# Patient Record
Sex: Male | Born: 1995 | Hispanic: No | Marital: Single | Smoking: Former smoker
Health system: Southern US, Community
[De-identification: ages and names within clinical notes are randomized; demographics above are authoritative.]

## PROBLEM LIST (undated history)

## (undated) DIAGNOSIS — J45909 Unspecified asthma, uncomplicated: Secondary | ICD-10-CM

---
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
## 2015-01-24 ENCOUNTER — Emergency Department: Payer: Self-pay | Admitting: Emergency Medicine
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
## 2016-09-25 ENCOUNTER — Encounter: Payer: Self-pay | Admitting: *Deleted

## 2016-09-25 ENCOUNTER — Emergency Department
Admission: EM | Admit: 2016-09-25 | Discharge: 2016-09-25 | Disposition: A | Discharge status: Home / Self Care | Payer: No Typology Code available for payment source | Attending: Emergency Medicine | Admitting: Emergency Medicine

## 2016-09-25 DIAGNOSIS — Y92410 Unspecified street and highway as the place of occurrence of the external cause: Secondary | ICD-10-CM | POA: Diagnosis not present

## 2016-09-25 DIAGNOSIS — Y999 Unspecified external cause status: Secondary | ICD-10-CM | POA: Diagnosis not present

## 2016-09-25 DIAGNOSIS — S39012A Strain of muscle, fascia and tendon of lower back, initial encounter: Secondary | ICD-10-CM

## 2016-09-25 DIAGNOSIS — S161XXA Strain of muscle, fascia and tendon at neck level, initial encounter: Secondary | ICD-10-CM

## 2016-09-25 DIAGNOSIS — Y9389 Activity, other specified: Secondary | ICD-10-CM | POA: Diagnosis not present

## 2016-09-25 DIAGNOSIS — S199XXA Unspecified injury of neck, initial encounter: Secondary | ICD-10-CM | POA: Diagnosis present

## 2016-09-25 MED ORDER — CYCLOBENZAPRINE HCL 5 MG PO TABS: 5.0000 mg | ORAL_TABLET | Freq: Three times a day (TID) | ORAL | 0 refills | Status: DC | PRN

## 2016-09-25 MED ORDER — NAPROXEN 500 MG PO TABS: 500.0000 mg | ORAL_TABLET | Freq: Two times a day (BID) | ORAL | 0 refills | Status: DC
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
## 2016-09-25 NOTE — Discharge Instructions (Signed)
Please take medications as prescribed and return to the ER for any worsening symptoms urgent changes in her health.
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
## 2016-12-07 ENCOUNTER — Emergency Department
Admission: EM | Admit: 2016-12-07 | Discharge: 2016-12-07 | Disposition: A | Discharge status: Home / Self Care | Payer: No Typology Code available for payment source | Attending: Emergency Medicine | Admitting: Emergency Medicine

## 2016-12-07 DIAGNOSIS — Y92410 Unspecified street and highway as the place of occurrence of the external cause: Secondary | ICD-10-CM | POA: Diagnosis not present

## 2016-12-07 DIAGNOSIS — S39012A Strain of muscle, fascia and tendon of lower back, initial encounter: Secondary | ICD-10-CM

## 2016-12-07 DIAGNOSIS — Y999 Unspecified external cause status: Secondary | ICD-10-CM | POA: Diagnosis not present

## 2016-12-07 DIAGNOSIS — S3992XA Unspecified injury of lower back, initial encounter: Secondary | ICD-10-CM | POA: Diagnosis present

## 2016-12-07 DIAGNOSIS — Y939 Activity, unspecified: Secondary | ICD-10-CM | POA: Insufficient documentation

## 2016-12-07 MED ORDER — CYCLOBENZAPRINE HCL 5 MG PO TABS: 5.0000 mg | ORAL_TABLET | Freq: Three times a day (TID) | ORAL | 0 refills | Status: DC | PRN

## 2016-12-07 MED ORDER — NABUMETONE 750 MG PO TABS: 750.0000 mg | ORAL_TABLET | Freq: Two times a day (BID) | ORAL | 0 refills | Status: DC
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
## 2022-11-16 ENCOUNTER — Other Ambulatory Visit: Payer: Self-pay

## 2022-11-16 ENCOUNTER — Emergency Department: Payer: Medicaid Other

## 2022-11-16 DIAGNOSIS — R059 Cough, unspecified: Secondary | ICD-10-CM | POA: Diagnosis present

## 2022-11-16 DIAGNOSIS — J101 Influenza due to other identified influenza virus with other respiratory manifestations: Secondary | ICD-10-CM | POA: Insufficient documentation

## 2022-11-16 DIAGNOSIS — Z1152 Encounter for screening for COVID-19: Secondary | ICD-10-CM | POA: Diagnosis not present

## 2022-11-16 LAB — RESP PANEL BY RT-PCR (RSV, FLU A&B, COVID)  RVPGX2
Influenza A by PCR: POSITIVE — AB
Influenza B by PCR: NEGATIVE
Resp Syncytial Virus by PCR: NEGATIVE
SARS Coronavirus 2 by RT PCR: NEGATIVE

## 2022-11-16 MED ORDER — ACETAMINOPHEN 325 MG PO TABS
650.0000 mg | ORAL_TABLET | Freq: Once | ORAL | Status: AC | PRN
  Administered 2022-11-16: 650 mg via ORAL
  Filled 2022-11-16: qty 2

## 2022-11-16 NOTE — ED Triage Notes (Signed)
Pt arrives via POV with CC of nonproductive cough (thin clear secretions) that began yesterday. Airway patent at this time.

## 2022-11-17 ENCOUNTER — Emergency Department
Admission: EM | Admit: 2022-11-17 | Discharge: 2022-11-17 | Disposition: A | Discharge status: Home / Self Care | Payer: Medicaid Other | Attending: Emergency Medicine | Admitting: Emergency Medicine

## 2022-11-17 DIAGNOSIS — J101 Influenza due to other identified influenza virus with other respiratory manifestations: Secondary | ICD-10-CM

## 2022-11-17 MED ORDER — ACETAMINOPHEN 500 MG PO TABS
ORAL_TABLET | ORAL | Status: AC
  Administered 2022-11-17: 1000 mg via ORAL
  Filled 2022-11-17: qty 2

## 2022-11-17 MED ORDER — ACETAMINOPHEN 500 MG PO TABS: 1000.0000 mg | ORAL_TABLET | Freq: Once | ORAL | Status: AC

## 2022-11-17 NOTE — Discharge Instructions (Signed)
Please take Tylenol and ibuprofen/Advil for your pain.  It is safe to take them together, or to alternate them every few hours.  Take up to 1000mg of Tylenol at a time, up to 4 times per day.  Do not take more than 4000 mg of Tylenol in 24 hours.  For ibuprofen, take 400-600 mg, 3 - 4 times per day.  

## 2022-11-17 NOTE — ED Notes (Signed)
Pt refused labs and EKG.

## 2022-11-17 NOTE — ED Provider Notes (Signed)
[facility] Provider Note    Event Date/Time   First MD Initiated Contact with Patient 11/17/22 [phone]     (approximate)   History   Cough   HPI  Albert Wagner is a 26 y.o. male who presents to the ED for evaluation of Cough   Patient presents to the ED for evaluation of 3 days of cough, myalgias and malaise.  Poor appetite without emesis or diarrhea.  No chest pain or syncope.  Physical Exam   Triage Vital Signs: ED Triage Vitals  Enc Vitals Group     BP 11/16/22 2210 (!) 111/52     Pulse Rate 11/16/22 2210 (!) 134     Resp 11/16/22 2210 (!) 24     Temp 11/16/22 2210 (!) 102.6 F (39.2 C)     Temp Source 11/16/22 2210 Oral     SpO2 11/16/22 2210 93 %     Weight 11/16/22 2216 224 lb (101.6 kg)     Height 11/16/22 2216 5\' 10"  (1.778 m)     Head Circumference --      Peak Flow --      Pain Score 11/16/22 2216 8     Pain Loc --      Pain Edu? --      Excl. in GC? --     Most recent vital signs: Vitals:   11/17/22 0102 11/17/22 0358  BP: 99/77 (!) 140/85  Pulse: (!) 129 (!) 140  Resp: 20 20  Temp: 99.5 F (37.5 C) 99.8 F (37.7 C)  SpO2: 94% 96%    General: Awake, no distress.  Ambulatory with normal gait independently. CV:  Good peripheral perfusion.  Tachycardic and regular Resp:  Normal effort.  Clear without wheezing Abd:  No distention.  Soft and benign MSK:  No deformity noted.  Neuro:  No focal deficits appreciated. Other:     ED Results / Procedures / Treatments   Labs (all labs ordered are listed, but only abnormal results are displayed) Labs Reviewed  RESP PANEL BY RT-PCR (RSV, FLU A&B, COVID)  RVPGX2 - Abnormal; Notable for the following components:      Result Value   Influenza A by PCR POSITIVE (*)    All other components within normal limits    EKG   RADIOLOGY CXR interpreted by me without evidence of acute cardiopulmonary pathology.  Official radiology report(s): DG Chest 2 View  Result Date:  11/16/2022 CLINICAL DATA:  Cough. EXAM: CHEST - 2 VIEW COMPARISON:  None Available. FINDINGS: The heart size and mediastinal contours are within normal limits. Both lungs are clear. The visualized skeletal structures are unremarkable. IMPRESSION: No active cardiopulmonary disease. Electronically Signed   By: 11/18/2022 M.D.   On: 11/16/2022 22:49    PROCEDURES and INTERVENTIONS:  Procedures  Medications  acetaminophen (TYLENOL) tablet 650 mg (650 mg Oral Given 11/16/22 2225)  acetaminophen (TYLENOL) tablet 1,000 mg (1,000 mg Oral Given 11/17/22 0421)     IMPRESSION / MDM / ASSESSMENT AND PLAN / ED COURSE  I reviewed the triage vital signs and the nursing notes.  Differential diagnosis includes, but is not limited to, dehydration, AKI, sepsis, pneumonia, COVID-19, pneumothorax  {Patient presents with symptoms of an acute illness or injury that is potentially life-threatening.  26 year old presents with an acute cough, tested positive for influenza A but refusing further workup despite his persistent tachycardia.  Initially febrile and after receiving antipyretics his heart rate really does not improve.  He maintains good  blood pressure without signs of shock.  CXR is reassuring and he test positive for flu A on his viral panel.  As below, I urged him to stay for further workup considering the persistent tachycardia but he adamantly refuses and he has capacity make this decision, acknowledging risk of death and disability.  Clinical Course as of 11/17/22 1497  Wynelle Link Nov 17, 2022  0404 Very explicit conversation with this patient that I strongly urged him to stay for blood work and EKG because of his persistent tachycardia despite his antipyretics.  He refuses.  I explained to him that this could be a sign of more severe derangements that are life-threatening.  He still refuses and wants to leave because he has been here for so long.  He acknowledges the possibility of worsening morbidity or  mortality because of his refusal and says he is ready to leave.  I believe he has capacity to make this decision. [DS]    Clinical Course User Index [DS] Delton Prairie, MD     FINAL CLINICAL IMPRESSION(S) / ED DIAGNOSES   Final diagnoses:  Influenza A     Rx / DC Orders   ED Discharge Orders     None        Note:  This document was prepared using Dragon voice recognition software and may include unintentional dictation errors.    Delton Prairie, MD 11/17/22 [id number]

## 2022-11-24 ENCOUNTER — Encounter (HOSPITAL_COMMUNITY): Payer: Self-pay

## 2022-11-24 ENCOUNTER — Encounter (HOSPITAL_BASED_OUTPATIENT_CLINIC_OR_DEPARTMENT_OTHER): Payer: Self-pay

## 2022-11-24 ENCOUNTER — Other Ambulatory Visit: Payer: Self-pay

## 2022-11-24 ENCOUNTER — Emergency Department (HOSPITAL_BASED_OUTPATIENT_CLINIC_OR_DEPARTMENT_OTHER): Payer: Medicaid Other

## 2022-11-24 ENCOUNTER — Inpatient Hospital Stay (HOSPITAL_BASED_OUTPATIENT_CLINIC_OR_DEPARTMENT_OTHER)
Admission: EM | Admit: 2022-11-24 | Discharge: 2022-11-26 | DRG: 193 | Disposition: A | Discharge status: Home / Self Care | Payer: Medicaid Other | Attending: Internal Medicine | Admitting: Internal Medicine

## 2022-11-24 DIAGNOSIS — R7401 Elevation of levels of liver transaminase levels: Secondary | ICD-10-CM | POA: Diagnosis not present

## 2022-11-24 DIAGNOSIS — R0602 Shortness of breath: Secondary | ICD-10-CM | POA: Diagnosis present

## 2022-11-24 DIAGNOSIS — J159 Unspecified bacterial pneumonia: Secondary | ICD-10-CM | POA: Diagnosis present

## 2022-11-24 DIAGNOSIS — J189 Pneumonia, unspecified organism: Secondary | ICD-10-CM | POA: Diagnosis not present

## 2022-11-24 DIAGNOSIS — J45901 Unspecified asthma with (acute) exacerbation: Principal | ICD-10-CM | POA: Diagnosis present

## 2022-11-24 DIAGNOSIS — J9601 Acute respiratory failure with hypoxia: Secondary | ICD-10-CM | POA: Diagnosis present

## 2022-11-24 DIAGNOSIS — Z87891 Personal history of nicotine dependence: Secondary | ICD-10-CM | POA: Diagnosis not present

## 2022-11-24 DIAGNOSIS — Z1152 Encounter for screening for COVID-19: Secondary | ICD-10-CM

## 2022-11-24 DIAGNOSIS — Z720 Tobacco use: Secondary | ICD-10-CM

## 2022-11-24 DIAGNOSIS — J1108 Influenza due to unidentified influenza virus with specified pneumonia: Secondary | ICD-10-CM | POA: Diagnosis present

## 2022-11-24 DIAGNOSIS — Z888 Allergy status to other drugs, medicaments and biological substances status: Secondary | ICD-10-CM

## 2022-11-24 DIAGNOSIS — J101 Influenza due to other identified influenza virus with other respiratory manifestations: Secondary | ICD-10-CM

## 2022-11-24 DIAGNOSIS — E876 Hypokalemia: Secondary | ICD-10-CM | POA: Diagnosis not present

## 2022-11-24 HISTORY — DX: Unspecified asthma, uncomplicated: J45.909

## 2022-11-24 LAB — CBC WITH DIFFERENTIAL/PLATELET
Abs Immature Granulocytes: 0.21 10*3/uL — ABNORMAL HIGH (ref 0.00–0.07)
Basophils Absolute: 0.1 10*3/uL (ref 0.0–0.1)
Basophils Relative: 1 %
Eosinophils Absolute: 0 10*3/uL (ref 0.0–0.5)
Eosinophils Relative: 0 %
HCT: 47.5 % (ref 39.0–52.0)
Hemoglobin: 16.4 g/dL (ref 13.0–17.0)
Immature Granulocytes: 1 %
Lymphocytes Relative: 10 %
Lymphs Abs: 1.9 10*3/uL (ref 0.7–4.0)
MCH: 31.5 pg (ref 26.0–34.0)
MCHC: 34.5 g/dL (ref 30.0–36.0)
MCV: 91.2 fL (ref 80.0–100.0)
Monocytes Absolute: 2 10*3/uL — ABNORMAL HIGH (ref 0.1–1.0)
Monocytes Relative: 10 %
Neutro Abs: 15.4 10*3/uL — ABNORMAL HIGH (ref 1.7–7.7)
Neutrophils Relative %: 78 %
Platelets: 371 10*3/uL (ref 150–400)
RBC: 5.21 MIL/uL (ref 4.22–5.81)
RDW: 12.8 % (ref 11.5–15.5)
WBC: 19.6 10*3/uL — ABNORMAL HIGH (ref 4.0–10.5)
nRBC: 0 % (ref 0.0–0.2)

## 2022-11-24 LAB — RESP PANEL BY RT-PCR (RSV, FLU A&B, COVID)  RVPGX2
Influenza A by PCR: POSITIVE — AB
Influenza B by PCR: NEGATIVE
Resp Syncytial Virus by PCR: NEGATIVE
SARS Coronavirus 2 by RT PCR: NEGATIVE

## 2022-11-24 LAB — LACTIC ACID, PLASMA: Lactic Acid, Venous: 1.8 mmol/L (ref 0.5–1.9)

## 2022-11-24 LAB — COMPREHENSIVE METABOLIC PANEL
ALT: 71 U/L — ABNORMAL HIGH (ref 0–44)
AST: 54 U/L — ABNORMAL HIGH (ref 15–41)
Albumin: 2.8 g/dL — ABNORMAL LOW (ref 3.5–5.0)
Alkaline Phosphatase: 117 U/L (ref 38–126)
Anion gap: 14 (ref 5–15)
BUN: 8 mg/dL (ref 6–20)
CO2: 23 mmol/L (ref 22–32)
Calcium: 8.5 mg/dL — ABNORMAL LOW (ref 8.9–10.3)
Chloride: 98 mmol/L (ref 98–111)
Creatinine, Ser: 1.17 mg/dL (ref 0.61–1.24)
GFR, Estimated: >60 mL/min (ref >60)
Glucose, Bld: 141 mg/dL — ABNORMAL HIGH (ref 70–99)
Potassium: 3.5 mmol/L (ref 3.5–5.1)
Sodium: 135 mmol/L (ref 135–145)
Total Bilirubin: 0.7 mg/dL (ref 0.3–1.2)
Total Protein: 8.1 g/dL (ref 6.5–8.1)

## 2022-11-24 MED ORDER — LACTATED RINGERS IV BOLUS
1000.0000 mL | Freq: Once | INTRAVENOUS | Status: AC
  Administered 2022-11-24: 1000 mL via INTRAVENOUS

## 2022-11-24 MED ORDER — ALBUTEROL SULFATE (2.5 MG/3ML) 0.083% IN NEBU
15.0000 mg/h | INHALATION_SOLUTION | RESPIRATORY_TRACT | Status: AC
  Administered 2022-11-24: 15 mg/h via RESPIRATORY_TRACT
  Filled 2022-11-24: qty 3

## 2022-11-24 MED ORDER — ALBUTEROL SULFATE (2.5 MG/3ML) 0.083% IN NEBU: 2.5000 mg | INHALATION_SOLUTION | Freq: Four times a day (QID) | RESPIRATORY_TRACT | Status: DC | PRN

## 2022-11-24 MED ORDER — METHYLPREDNISOLONE SODIUM SUCC 125 MG IJ SOLR
125.0000 mg | Freq: Once | INTRAMUSCULAR | Status: AC
  Administered 2022-11-24: 125 mg via INTRAVENOUS
  Filled 2022-11-24: qty 2

## 2022-11-24 MED ORDER — MAGNESIUM SULFATE 2 GM/50ML IV SOLN
2.0000 g | Freq: Once | INTRAVENOUS | Status: AC
  Administered 2022-11-24: 2 g via INTRAVENOUS
  Filled 2022-11-24: qty 50

## 2022-11-24 MED ORDER — OSELTAMIVIR PHOSPHATE 75 MG PO CAPS
75.0000 mg | ORAL_CAPSULE | Freq: Two times a day (BID) | ORAL | Status: DC
  Administered 2022-11-24 – 2022-11-26 (×4): 75 mg via ORAL
  Filled 2022-11-24 (×5): qty 1

## 2022-11-24 MED ORDER — ORAL CARE MOUTH RINSE: 15.0000 mL | OROMUCOSAL | Status: DC | PRN

## 2022-11-24 MED ORDER — SODIUM CHLORIDE 0.9 % IV SOLN
1.0000 g | Freq: Once | INTRAVENOUS | Status: AC
  Administered 2022-11-24: 1 g via INTRAVENOUS
  Filled 2022-11-24: qty 10

## 2022-11-24 MED ORDER — LACTATED RINGERS IV BOLUS (SEPSIS)
1000.0000 mL | Freq: Once | INTRAVENOUS | Status: AC
  Administered 2022-11-24: 1000 mL via INTRAVENOUS

## 2022-11-24 MED ORDER — ALBUTEROL SULFATE (2.5 MG/3ML) 0.083% IN NEBU
INHALATION_SOLUTION | RESPIRATORY_TRACT | Status: AC
  Administered 2022-11-24: 2.5 mg via RESPIRATORY_TRACT
  Filled 2022-11-24: qty 3

## 2022-11-24 MED ORDER — ALBUTEROL (5 MG/ML) CONTINUOUS INHALATION SOLN
10.0000 mg/h | INHALATION_SOLUTION | Freq: Once | RESPIRATORY_TRACT | Status: AC
  Administered 2022-11-24: 10 mg/h via RESPIRATORY_TRACT
  Filled 2022-11-24: qty 20

## 2022-11-24 MED ORDER — TERBUTALINE SULFATE 1 MG/ML IJ SOLN
0.2500 mg | Freq: Once | INTRAMUSCULAR | Status: AC
  Administered 2022-11-24: 0.25 mg via SUBCUTANEOUS
  Filled 2022-11-24: qty 1

## 2022-11-24 MED ORDER — LACTATED RINGERS IV SOLN: INTRAVENOUS | Status: AC

## 2022-11-24 MED ORDER — ENOXAPARIN SODIUM 40 MG/0.4ML IJ SOSY
40.0000 mg | PREFILLED_SYRINGE | INTRAMUSCULAR | Status: DC
  Administered 2022-11-24 – 2022-11-25 (×2): 40 mg via SUBCUTANEOUS
  Filled 2022-11-24 (×2): qty 0.4

## 2022-11-24 MED ORDER — ALBUTEROL SULFATE (2.5 MG/3ML) 0.083% IN NEBU: 2.5000 mg | INHALATION_SOLUTION | Freq: Once | RESPIRATORY_TRACT | Status: AC

## 2022-11-24 MED ORDER — SODIUM CHLORIDE 0.9 % IV SOLN: INTRAVENOUS | Status: DC | PRN

## 2022-11-24 MED ORDER — CHLORHEXIDINE GLUCONATE CLOTH 2 % EX PADS
6.0000 | MEDICATED_PAD | Freq: Every day | CUTANEOUS | Status: DC
  Administered 2022-11-24 – 2022-11-25 (×2): 6 via TOPICAL

## 2022-11-24 MED ORDER — IPRATROPIUM-ALBUTEROL 0.5-2.5 (3) MG/3ML IN SOLN
RESPIRATORY_TRACT | Status: AC
  Administered 2022-11-24: 3 mL via RESPIRATORY_TRACT
  Filled 2022-11-24: qty 3

## 2022-11-24 MED ORDER — SODIUM CHLORIDE 0.9 % IV SOLN
500.0000 mg | Freq: Once | INTRAVENOUS | Status: AC
  Administered 2022-11-24: 500 mg via INTRAVENOUS
  Filled 2022-11-24: qty 5

## 2022-11-24 MED ORDER — ONDANSETRON HCL 4 MG/2ML IJ SOLN
4.0000 mg | Freq: Once | INTRAMUSCULAR | Status: AC
  Administered 2022-11-24: 4 mg via INTRAVENOUS
  Filled 2022-11-24: qty 2

## 2022-11-24 MED ORDER — IPRATROPIUM-ALBUTEROL 0.5-2.5 (3) MG/3ML IN SOLN: 3.0000 mL | Freq: Once | RESPIRATORY_TRACT | Status: AC

## 2022-11-24 NOTE — H&P (Signed)
History and Physical    Patient: Albert Wagner KVQ:[id number] DOB: 08-03-1996 DOA: 11/24/2022 DOS: the patient was seen and examined on 11/24/2022 PCP: Patient, No Pcp Per  Patient coming from:  [facility] ED  Chief Complaint:  Chief Complaint  Patient presents with   Shortness of Breath   HPI: Albert Wagner is a 26 y.o. male with medical history significant of childhood asthma and tobacco use who presents with increasing shortness of breath.   Patient recent diagnosed with Flu about a week ago and seen at [place] ED. However he left after refusing blood work. He was not treated with Tamiflu. Symptoms continued to worsening with cough, shortness of breath and decrease appetite.   In ED,  he was tacycardic, tachypneic with use of accessory muscle and hypoxic to 84%. He was given Duonebs, steroids with minimal improvement in symptoms and was additionally given magnesium and given continuous nebs. Also received Terbutaline. He was then transfer to [facility] for further management.   Review of Systems: As mentioned in the history of present illness. All other systems reviewed and are negative. Past Medical History:  Diagnosis Date   Asthma    History reviewed. No pertinent surgical history. Social History:  reports that he has quit smoking. His smoking use included cigarettes. He has never used smokeless tobacco. He reports current alcohol use. He reports that he does not use drugs.  No Known Allergies  History reviewed. No pertinent family history.  Prior to Admission medications   Medication Sig Start Date End Date Taking? Authorizing Provider  cyclobenzaprine (FLEXERIL) 5 MG tablet Take 1 tablet (5 mg total) by mouth 3 (three) times daily as needed for muscle spasms. 12/07/16   Menshew, Charlesetta Ivory, PA-C  nabumetone (RELAFEN) 750 MG tablet Take 1 tablet (750 mg total) by mouth 2 (two) times daily. 12/07/16   Menshew, Charlesetta Ivory, PA-C    Physical Exam: Vitals:   11/24/22 1617 11/24/22 1700  11/24/22 1713 11/24/22 1811  BP:  121/67  (!) 157/93  Pulse: (!) 125 (!) 124  (!) 124  Resp: (!) 23   (!) 34  Temp:    (!) 97.4 F (36.3 C)  TempSrc:    Oral  SpO2: 97% 97% 97% 94%  Weight:    93.9 kg  Height:    5\' 10"  (1.778 m)   Constitutional: NAD, calm, ill-appearing young male sitting upright in bed Eyes: lids and conjunctivae normal ENMT: Mucous membranes are moist. Neck: normal, supple Respiratory: Diffuse crackles but no wheezing.  Normal respiratory effort on high flow nasal cannula. No accessory muscle use.  Able to speak in full sentences. Cardiovascular: Tachycardia, no murmurs / rubs / gallops. No extremity edema.  Abdomen: no tenderness,  Bowel sounds positive.  Musculoskeletal: no clubbing / cyanosis. No joint deformity upper and lower extremities. Good ROM, no contractures. Normal muscle tone.  Skin: no rashes, lesions, ulcers. No induration Neurologic: CN 2-12 grossly intact.  Strength 5/5 in all 4.  Psychiatric: Normal judgment and insight. Alert and oriented x 3. Normal mood. Data Reviewed:  See HPI  Assessment and Plan: * Acute hypoxic respiratory failure (HCC) -Secondary to flu vs superimposed bacterial pneumonia -requiring HFNC 12 lpm -started on IV Rocephin and Azithromycin in ED. Will check procalcitonin.  -He is a week into his Flu symptoms but given he is ill with high O2 requirement, will initate Tamiflu.  -continue to wean O2 as tolerated with goal of > 92% -PRN albuterol nebulizer    Tobacco  abuse Reports at least half pack daily. Last use a week ago before illness. Encourage cessation.  Transaminitis -mildly elevated. Likely reactive to infection. Follow with repeat CMP in the morning.      Advance Care Planning: Full  Consults: none  Family Communication: none at bedside  Severity of Illness: The appropriate patient status for this patient is INPATIENT. Inpatient status is judged to be reasonable and necessary in order to provide the  required intensity of service to ensure the patient's safety. The patient's presenting symptoms, physical exam findings, and initial radiographic and laboratory data in the context of their chronic comorbidities is felt to place them at high risk for further clinical deterioration. Furthermore, it is not anticipated that the patient will be medically stable for discharge from the hospital within 2 midnights of admission.   * I certify that at the point of admission it is my clinical judgment that the patient will require inpatient hospital care spanning beyond 2 midnights from the point of admission due to high intensity of service, high risk for further deterioration and high frequency of surveillance required.*  Author: Anselm Jungling, DO 11/24/2022 8:25 PM  For on call review [email or web address].

## 2022-11-24 NOTE — Assessment & Plan Note (Addendum)
-  Secondary to flu vs superimposed bacterial pneumonia -requiring HFNC 12 lpm -started on IV Rocephin and Azithromycin in ED. Will check procalcitonin.  -He is a week into his Flu symptoms but given he is ill with high O2 requirement, will initate Tamiflu.  -continue to wean O2 as tolerated with goal of > 92% -PRN albuterol nebulizer
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
## 2022-11-24 NOTE — ED Provider Notes (Signed)
[facility] EMERGENCY DEPARTMENT Provider Note   CSN: [id number] Arrival date & time: 11/24/22  1148     History  Chief Complaint  Patient presents with   Shortness of Breath    Albert Wagner is a 26 y.o. male.  Patient is a 26 year old male with a past medical history of childhood asthma and has not required albuterol in years presenting to the emergency department with shortness of breath.  Patient states that he has had cough and congestion for the past week and was initially seen in the ER and diagnosed with influenza.  He states that his symptoms have been worsening over the last few days and he feels like he cannot catch his breath.  He states that he is having chest tightness and chest pain when he coughs.  He states that he has been nauseous and vomiting and having diarrhea.  He denies any fevers at home.  He states that he has not had any albuterol at home to try.  He states that he was admitted for asthma as a child but that was almost 25 years ago.   Shortness of Breath      Home Medications Prior to Admission medications   Medication Sig Start Date End Date Taking? Authorizing Provider  cyclobenzaprine (FLEXERIL) 5 MG tablet Take 1 tablet (5 mg total) by mouth 3 (three) times daily as needed for muscle spasms. 12/07/16   Menshew, Dannielle Karvonen, PA-C  nabumetone (RELAFEN) 750 MG tablet Take 1 tablet (750 mg total) by mouth 2 (two) times daily. 12/07/16   Menshew, Dannielle Karvonen, PA-C      Allergies    Patient has no known allergies.    Review of Systems   Review of Systems  Respiratory:  Positive for shortness of breath.     Physical Exam Updated Vital Signs BP 121/61   Pulse (!) 126   Temp 98.8 F (37.1 C) (Oral)   Resp (!) 30   Ht 5\' 10"  (1.778 m)   Wt 99.8 kg   SpO2 95%   BMI 31.57 kg/m  Physical Exam Vitals and nursing note reviewed.  Constitutional:      General: He is in acute distress (respiratory distress).     Appearance: He is  well-developed. He is ill-appearing. He is not diaphoretic.  HENT:     Head: Normocephalic and atraumatic.     Mouth/Throat:     Mouth: Mucous membranes are moist.     Pharynx: Oropharynx is clear.  Eyes:     Pupils: Pupils are equal, round, and reactive to light.  Cardiovascular:     Rate and Rhythm: Regular rhythm. Tachycardia present.     Pulses: Normal pulses.     Heart sounds: Normal heart sounds.  Pulmonary:     Effort: Tachypnea and accessory muscle usage (Belly breathing, no retractions, speaking in full sentences) present.     Breath sounds: Decreased breath sounds (diffuse, prolonged expiratory phase) present.  Chest:     Chest wall: No tenderness.  Abdominal:     Palpations: Abdomen is soft.     Tenderness: There is no abdominal tenderness.  Musculoskeletal:        General: Normal range of motion.     Cervical back: Normal range of motion.     Right lower leg: No edema.     Left lower leg: No edema.  Skin:    General: Skin is warm and dry.  Neurological:     General: No focal  deficit present.     Mental Status: He is alert and oriented to person, place, and time.  Psychiatric:        Mood and Affect: Mood normal.        Behavior: Behavior normal.     ED Results / Procedures / Treatments   Labs (all labs ordered are listed, but only abnormal results are displayed) Labs Reviewed  RESP PANEL BY RT-PCR (RSV, FLU A&B, COVID)  [id number] - Abnormal; Notable for the following components:      Result Value   Influenza A by PCR POSITIVE (*)    All other components within normal limits  CBC WITH DIFFERENTIAL/PLATELET - Abnormal; Notable for the following components:   WBC 19.6 (*)    Neutro Abs 15.4 (*)    Monocytes Absolute 2.0 (*)    Abs Immature Granulocytes 0.21 (*)    All other components within normal limits  COMPREHENSIVE METABOLIC PANEL - Abnormal; Notable for the following components:   Glucose, Bld 141 (*)    Calcium 8.5 (*)    Albumin 2.8 (*)    AST 54  (*)    ALT 71 (*)    All other components within normal limits  CULTURE, BLOOD (ROUTINE X 2)  CULTURE, BLOOD (ROUTINE X 2)  LACTIC ACID, PLASMA    EKG None  Radiology DG Chest Portable 1 View  Result Date: 11/24/2022 CLINICAL DATA:  Shortness of breath. Patient diagnosed with flu last week. EXAM: PORTABLE CHEST 1 VIEW COMPARISON:  November 16, 2022 FINDINGS: The heart, hila, and mediastinum are normal. No pneumothorax. No nodules or masses. Bilateral pulmonary opacities, particularly in the bases, largely interstitial with a somewhat micro nodular appearance. No other abnormalities are identified. IMPRESSION: Bilateral pulmonary opacities, particularly in the bases, with a somewhat micro nodular appearance. The findings are nonspecific but favored to represent atypical infection given history and a normal chest x-ray November 16, 2022. Recommend clinical correlation and short-term follow-up imaging to ensure resolution. Electronically Signed   By: Dorise Bullion III M.D.   On: 11/24/2022 12:20    Procedures .Critical Care  Performed by: Kemper Durie, DO Authorized by: Kemper Durie, DO   Critical care provider statement:    Critical care time (minutes):  40   Critical care time was exclusive of:  Separately billable procedures and treating other patients   Critical care was necessary to treat or prevent imminent or life-threatening deterioration of the following conditions:  Respiratory failure   Critical care was time spent personally by me on the following activities:  Development of treatment plan with patient or surrogate, discussions with primary provider, evaluation of patient's response to treatment, examination of patient, obtaining history from patient or surrogate, ordering and performing treatments and interventions, ordering and review of laboratory studies, ordering and review of radiographic studies, pulse oximetry, re-evaluation of patient's condition and  review of old charts   I assumed direction of critical care for this patient from another provider in my specialty: no     Care discussed with: admitting provider       Medications Ordered in ED Medications  0.9 %  sodium chloride infusion ( Intravenous New Bag/Given 11/24/22 1419)  albuterol (PROVENTIL) (2.5 MG/3ML) 0.083% nebulizer solution (0 mg/hr Nebulization Stopped 11/24/22 1430)  lactated ringers infusion ( Intravenous New Bag/Given 11/24/22 1428)  lactated ringers bolus 1,000 mL (1,000 mLs Intravenous New Bag/Given 11/24/22 1403)  terbutaline (BRETHINE) injection 0.25 mg (has no administration in time range)  albuterol (  PROVENTIL,VENTOLIN) solution continuous neb (has no administration in time range)  ipratropium-albuterol (DUONEB) 0.5-2.5 (3) MG/3ML nebulizer solution 3 mL (3 mLs Nebulization Given 11/24/22 1217)  albuterol (PROVENTIL) (2.5 MG/3ML) 0.083% nebulizer solution 2.5 mg (2.5 mg Nebulization Given 11/24/22 1217)  methylPREDNISolone sodium succinate (SOLU-MEDROL) 125 mg/2 mL injection 125 mg (125 mg Intravenous Given 11/24/22 1230)  magnesium sulfate IVPB 2 g 50 mL (2 g Intravenous New Bag/Given 11/24/22 1244)  ondansetron (ZOFRAN) injection 4 mg (4 mg Intravenous Given 11/24/22 1230)  lactated ringers bolus 1,000 mL (0 mLs Intravenous Stopped 11/24/22 1403)  cefTRIAXone (ROCEPHIN) 1 g in sodium chloride 0.9 % 100 mL IVPB (1 g Intravenous New Bag/Given 11/24/22 1423)  azithromycin (ZITHROMAX) 500 mg in sodium chloride 0.9 % 250 mL IVPB (500 mg Intravenous New Bag/Given 11/24/22 1309)    ED Course/ Medical Decision Making/ A&P Clinical Course as of 11/24/22 1458  Sun Nov 24, 2022  1446 Patient has completed first hour of continuous nebs with some improvement in upper airway movement, continues to have decreased air movement in the lower lung bases. He is requesting a 30 min break of nebs and will be resumed in 30 min. He will be given terbutaline for his continued work of  breathing. [VK]    Clinical Course User Index [VK] Rexford Maus, DO                           Medical Decision Making This patient presents to the ED with chief complaint(s) of shortness of breath with pertinent past medical history of childhood asthma, recently diagnosed Flu which further complicates the presenting complaint. The complaint involves an extensive differential diagnosis and also carries with it a high risk of complications and morbidity.    The differential diagnosis includes asthma exacerbation, pneumonia, pneumothorax, pulmonary edema, pleural effusion, respiratory distress, PE less likely due to patient's tight breath sounds and asthma more likely causing of his tachycardia and tachypnea  Additional history obtained: Additional history obtained from N/A Records reviewed recent ED records  ED Course and Reassessment: On patient's arrival to the emergency department he was tachycardic and tachypneic and belly breathing though has no retractions and able to speak in normal sentences.  He was hypoxic to the 80s on room air.  Patient had an tight breath sounds with minimal air movement, concerning for asthma exacerbation.  Initially started on DuoNebs and given steroids, however had minimal improvement in his respiratory status and was additionally given magnesium and started on continuous nebs.  He had chest x-ray and labs performed and chest x-ray was concerning for bilateral infiltrates concerning for multifocal pneumonia and he was started on antibiotics.  Independent labs interpretation:  The following labs were independently interpreted: leukocytosis, mild hypokalemia, otherwise within normal range  Independent visualization of imaging: - I independently visualized the following imaging with scope of interpretation limited to determining acute life threatening conditions related to emergency care: CXR, which revealed bilateral patchy infiltrates concerning for  pneumonia  Consultation: - Consulted or discussed management/test interpretation w/ external professional: hospitalist  Consideration for admission or further workup: patient requires admission for further management of his asthma exacerbation with multifocal pneumonia in the setting of the flu Social Determinants of health: N/A    Amount and/or Complexity of Data Reviewed Labs: ordered. Radiology: ordered.  Risk Prescription drug management. Decision regarding hospitalization.          Final Clinical Impression(s) / ED Diagnoses Final diagnoses:  Severe asthma with exacerbation, unspecified whether persistent  Influenza A  Multifocal pneumonia    Rx / DC Orders ED Discharge Orders     None         Kemper Durie, DO 11/24/22 1458

## 2022-11-24 NOTE — Sepsis Progress Note (Signed)
eLink monitoring code sepsis.  

## 2022-11-24 NOTE — ED Notes (Signed)
CL arrived at bedside.

## 2022-11-24 NOTE — ED Notes (Signed)
ED Provider at bedside. 

## 2022-11-24 NOTE — ED Notes (Signed)
Both BC drawn @1410 , 1st set from right hand, 2nd set from left hand.

## 2022-11-24 NOTE — ED Notes (Signed)
ED TO INPATIENT HANDOFF REPORT  ED Nurse Name and Phone #: Marlin Jarrard, RN  S Name/Age/Gender Sherlynn Stalls 26 y.o. male Room/Bed: MH05/MH05  Code Status   Code Status: Not on file  Home/SNF/Other Home Patient oriented to: self, place, time, and situation Is this baseline? Yes   Triage Complete: Triage complete  Chief Complaint Multifocal pneumonia [J18.9]  Triage Note No notes on file   Allergies No Known Allergies  Level of Care/Admitting Diagnosis ED Disposition     ED Disposition  Admit   Condition  --   [facility]: [facility] [[id number]]  Level of Care: Stepdown [14]  Admit to SDU based on following criteria: Respiratory Distress:  Frequent assessment and/or intervention to maintain adequate ventilation/respiration, pulmonary toilet, and respiratory treatment.  May [facility] patient to [facility] or [facility] if equivalent level of care is available:: Yes  Interfacility transfer: Yes  Covid Evaluation: Confirmed COVID Negative  Diagnosis: Multifocal pneumonia [id number]  Admitting Physician: Donne Hazel [id number]  Attending Physician: Kemper Durie [id number]  Certification:: I certify this patient will need inpatient services for at least 2 midnights  Estimated Length of Stay: 3          B Medical/Surgery History Past Medical History:  Diagnosis Date   Asthma    History reviewed. No pertinent surgical history.   A IV Location/Drains/Wounds Patient Lines/Drains/Airways Status     Active Line/Drains/Airways     Name Placement date Placement time Site Days   Peripheral IV 11/24/22 20 G Left Antecubital 11/24/22  1215  Antecubital  less than 1   Peripheral IV 11/24/22 20 G Anterior;Proximal;Right Forearm 11/24/22  1306  Forearm  less than 1            Intake/Output Last 24 hours No intake or output data in the 24 hours ending 11/24/22 1622  Labs/Imaging Results for orders placed or performed during  the hospital encounter of 11/24/22 (from the past 48 hour(s))  Resp panel by RT-PCR (RSV, Flu A&B, Covid) Anterior Nasal Swab     Status: Abnormal   Collection Time: 11/24/22 12:18 PM   Specimen: Anterior Nasal Swab  Result Value Ref Range   SARS Coronavirus 2 by RT PCR NEGATIVE NEGATIVE    Comment: (NOTE) SARS-CoV-2 target nucleic acids are NOT DETECTED.  The SARS-CoV-2 RNA is generally detectable in upper respiratory specimens during the acute phase of infection. The lowest concentration of SARS-CoV-2 viral copies this assay can detect is 138 copies/mL. A negative result does not preclude SARS-Cov-2 infection and should not be used as the sole basis for treatment or other patient management decisions. A negative result may occur with  improper specimen collection/handling, submission of specimen other than nasopharyngeal swab, presence of viral mutation(s) within the areas targeted by this assay, and inadequate number of viral copies(<138 copies/mL). A negative result must be combined with clinical observations, patient history, and epidemiological information. The expected result is Negative.  Fact Sheet for Patients:  EntrepreneurPulse.com.au  Fact Sheet for Healthcare Providers:  IncredibleEmployment.be  This test is no t yet approved or cleared by the [place] FDA and  has been authorized for detection and/or diagnosis of SARS-CoV-2 by FDA under an Emergency Use Authorization (EUA). This EUA will remain  in effect (meaning this test can be used) for the duration of the COVID-19 declaration under Section 564(b)(1) of the Act, 21 U.S.C.section 360bbb-3(b)(1), unless the authorization is terminated  or revoked sooner.  Influenza A by PCR POSITIVE (A) NEGATIVE   Influenza B by PCR NEGATIVE NEGATIVE    Comment: (NOTE) The Xpert Xpress SARS-CoV-2/FLU/RSV plus assay is intended as an aid in the diagnosis of influenza from  Nasopharyngeal swab specimens and should not be used as a sole basis for treatment. Nasal washings and aspirates are unacceptable for Xpert Xpress SARS-CoV-2/FLU/RSV testing.  Fact Sheet for Patients: EntrepreneurPulse.com.au  Fact Sheet for Healthcare Providers: IncredibleEmployment.be  This test is not yet approved or cleared by the [place] FDA and has been authorized for detection and/or diagnosis of SARS-CoV-2 by FDA under an Emergency Use Authorization (EUA). This EUA will remain in effect (meaning this test can be used) for the duration of the COVID-19 declaration under Section 564(b)(1) of the Act, 21 U.S.C. section 360bbb-3(b)(1), unless the authorization is terminated or revoked.     Resp Syncytial Virus by PCR NEGATIVE NEGATIVE    Comment: (NOTE) Fact Sheet for Patients: EntrepreneurPulse.com.au  Fact Sheet for Healthcare Providers: IncredibleEmployment.be  This test is not yet approved or cleared by the [place] FDA and has been authorized for detection and/or diagnosis of SARS-CoV-2 by FDA under an Emergency Use Authorization (EUA). This EUA will remain in effect (meaning this test can be used) for the duration of the COVID-19 declaration under Section 564(b)(1) of the Act, 21 U.S.C. section 360bbb-3(b)(1), unless the authorization is terminated or revoked.  Performed at [facility], [address] [place], [place] [address]   CBC with Differential     Status: Abnormal   Collection Time: 11/24/22 12:18 PM  Result Value Ref Range   WBC 19.6 (H) 4.0 - 10.5 K/uL   RBC 5.21 4.22 - 5.81 MIL/uL   Hemoglobin 16.4 13.0 - 17.0 g/dL   HCT 47.5 39.0 - 52.0 %   MCV 91.2 80.0 - 100.0 fL   MCH 31.5 26.0 - 34.0 pg   MCHC 34.5 30.0 - 36.0 g/dL   RDW 12.8 11.5 - 15.5 %   Platelets 371 150 - 400 K/uL   nRBC 0.0 0.0 - 0.2 %   Neutrophils Relative % 78 %   Neutro Abs 15.4 (H) 1.7  - 7.7 K/uL   Lymphocytes Relative 10 %   Lymphs Abs 1.9 0.7 - 4.0 K/uL   Monocytes Relative 10 %   Monocytes Absolute 2.0 (H) 0.1 - 1.0 K/uL   Eosinophils Relative 0 %   Eosinophils Absolute 0.0 0.0 - 0.5 K/uL   Basophils Relative 1 %   Basophils Absolute 0.1 0.0 - 0.1 K/uL   Immature Granulocytes 1 %   Abs Immature Granulocytes 0.21 (H) 0.00 - 0.07 K/uL    Comment: Performed at [facility], [address] [place], [place] [address]  Comprehensive metabolic panel     Status: Abnormal   Collection Time: 11/24/22 12:18 PM  Result Value Ref Range   Sodium 135 135 - 145 mmol/L   Potassium 3.5 3.5 - 5.1 mmol/L   Chloride 98 98 - 111 mmol/L   CO2 23 22 - 32 mmol/L   Glucose, Bld 141 (H) 70 - 99 mg/dL    Comment: Glucose reference range applies only to samples taken after fasting for at least 8 hours.   BUN 8 6 - 20 mg/dL   Creatinine, Ser 1.17 0.61 - 1.24 mg/dL   Calcium 8.5 (L) 8.9 - 10.3 mg/dL   Total Protein 8.1 6.5 - 8.1 g/dL   Albumin 2.8 (L) 3.5 - 5.0 g/dL  AST 54 (H) 15 - 41 U/L   ALT 71 (H) 0 - 44 U/L   Alkaline Phosphatase 117 38 - 126 U/L   Total Bilirubin 0.7 0.3 - 1.2 mg/dL   GFR, Estimated >88 >41 mL/min    Comment: (NOTE) Calculated using the CKD-EPI Creatinine Equation (2021)    Anion gap 14 5 - 15    Comment: Performed at [facility], [address] [place], [place] [address]  Lactic acid, plasma     Status: None   Collection Time: 11/24/22  2:09 PM  Result Value Ref Range   Lactic Acid, Venous 1.8 0.5 - 1.9 mmol/L    Comment: Performed at [facility], [address] [place], [place] [address]   DG Chest Portable 1 View  Result Date: 11/24/2022 CLINICAL DATA:  Shortness of breath. Patient diagnosed with flu last week. EXAM: PORTABLE CHEST 1 VIEW COMPARISON:  November 16, 2022 FINDINGS: The heart, hila, and mediastinum are normal. No pneumothorax. No nodules or masses. Bilateral pulmonary opacities, particularly in the  bases, largely interstitial with a somewhat micro nodular appearance. No other abnormalities are identified. IMPRESSION: Bilateral pulmonary opacities, particularly in the bases, with a somewhat micro nodular appearance. The findings are nonspecific but favored to represent atypical infection given history and a normal chest x-ray November 16, 2022. Recommend clinical correlation and short-term follow-up imaging to ensure resolution. Electronically Signed   By: Gerome Sam III M.D.   On: 11/24/2022 12:20    Pending Labs Unresulted Labs (From admission, onward)     Start     Ordered   11/24/22 1351  Blood culture (routine x 2)  BLOOD CULTURE X 2,   STAT      11/24/22 1350            Vitals/Pain Today's Vitals   11/24/22 1540 11/24/22 1600 11/24/22 1600 11/24/22 1617  BP: 115/67 (!) 119/52    Pulse: (!) 125 (!) 125  (!) 125  Resp: (!) 35 (!) 30  (!) 23  Temp:  98.6 F (37 C)    TempSrc:  Oral    SpO2: 95% 96%  97%  Weight:      Height:      PainSc:   5      Isolation Precautions No active isolations  Medications Medications  0.9 %  sodium chloride infusion (0 mLs Intravenous Stopped 11/24/22 1552)  albuterol (PROVENTIL) (2.5 MG/3ML) 0.083% nebulizer solution (0 mg/hr Nebulization Stopped 11/24/22 1430)  lactated ringers infusion ( Intravenous New Bag/Given 11/24/22 1428)  ipratropium-albuterol (DUONEB) 0.5-2.5 (3) MG/3ML nebulizer solution 3 mL (3 mLs Nebulization Given 11/24/22 1217)  albuterol (PROVENTIL) (2.5 MG/3ML) 0.083% nebulizer solution 2.5 mg (2.5 mg Nebulization Given 11/24/22 1217)  methylPREDNISolone sodium succinate (SOLU-MEDROL) 125 mg/2 mL injection 125 mg (125 mg Intravenous Given 11/24/22 1230)  magnesium sulfate IVPB 2 g 50 mL (0 g Intravenous Stopped 11/24/22 1539)  ondansetron (ZOFRAN) injection 4 mg (4 mg Intravenous Given 11/24/22 1230)  lactated ringers bolus 1,000 mL (0 mLs Intravenous Stopped 11/24/22 1403)  cefTRIAXone (ROCEPHIN) 1 g in sodium  chloride 0.9 % 100 mL IVPB (0 g Intravenous Stopped 11/24/22 1559)  azithromycin (ZITHROMAX) 500 mg in sodium chloride 0.9 % 250 mL IVPB (0 mg Intravenous Stopped 11/24/22 1552)  lactated ringers bolus 1,000 mL (0 mLs Intravenous Stopped 11/24/22 1553)  terbutaline (BRETHINE) injection 0.25 mg (0.25 mg Subcutaneous Given 11/24/22 1557)  albuterol (PROVENTIL,VENTOLIN) solution continuous neb (10 mg/hr Nebulization Given  11/24/22 1515)    Mobility walks Low fall risk   Focused Assessments Pulmonary Assessment Handoff:  Lung sounds: Bilateral Breath Sounds: Diminished, Rhonchi O2 Device: High Flow Nasal Cannula O2 Flow Rate (L/min): 10 L/min    R Recommendations: See Admitting Provider Note  Report given to:   Additional Notes:

## 2022-11-24 NOTE — Assessment & Plan Note (Signed)
-  mildly elevated. Likely reactive to infection. Follow with repeat CMP in the morning.

## 2022-11-24 NOTE — ED Notes (Signed)
Unable to perform PEFR secondary to resp status.

## 2022-11-24 NOTE — Assessment & Plan Note (Signed)
Reports at least half pack daily. Last use a week ago before illness. Encourage cessation.

## 2022-11-25 ENCOUNTER — Inpatient Hospital Stay (HOSPITAL_COMMUNITY): Payer: Medicaid Other

## 2022-11-25 LAB — COMPREHENSIVE METABOLIC PANEL
ALT: 136 U/L — ABNORMAL HIGH (ref 0–44)
AST: 140 U/L — ABNORMAL HIGH (ref 15–41)
Albumin: 2.4 g/dL — ABNORMAL LOW (ref 3.5–5.0)
Alkaline Phosphatase: 86 U/L (ref 38–126)
Anion gap: 10 (ref 5–15)
BUN: 11 mg/dL (ref 6–20)
CO2: 26 mmol/L (ref 22–32)
Calcium: 8.3 mg/dL — ABNORMAL LOW (ref 8.9–10.3)
Chloride: 104 mmol/L (ref 98–111)
Creatinine, Ser: 0.77 mg/dL (ref 0.61–1.24)
GFR, Estimated: >60 mL/min (ref >60)
Glucose, Bld: 171 mg/dL — ABNORMAL HIGH (ref 70–99)
Potassium: 3.4 mmol/L — ABNORMAL LOW (ref 3.5–5.1)
Sodium: 140 mmol/L (ref 135–145)
Total Bilirubin: 0.4 mg/dL (ref 0.3–1.2)
Total Protein: 6.2 g/dL — ABNORMAL LOW (ref 6.5–8.1)

## 2022-11-25 LAB — CBC
HCT: 41.5 % (ref 39.0–52.0)
Hemoglobin: 14.1 g/dL (ref 13.0–17.0)
MCH: 32.1 pg (ref 26.0–34.0)
MCHC: 34 g/dL (ref 30.0–36.0)
MCV: 94.5 fL (ref 80.0–100.0)
Platelets: 353 10*3/uL (ref 150–400)
RBC: 4.39 MIL/uL (ref 4.22–5.81)
RDW: 13.2 % (ref 11.5–15.5)
WBC: 24.9 10*3/uL — ABNORMAL HIGH (ref 4.0–10.5)
nRBC: 0 % (ref 0.0–0.2)

## 2022-11-25 LAB — MAGNESIUM: Magnesium: 2.5 mg/dL — ABNORMAL HIGH (ref 1.7–2.4)

## 2022-11-25 LAB — PROCALCITONIN: Procalcitonin: 0.16 ng/mL

## 2022-11-25 LAB — HIV ANTIBODY (ROUTINE TESTING W REFLEX): HIV Screen 4th Generation wRfx: NONREACTIVE

## 2022-11-25 MED ORDER — GUAIFENESIN-DM 100-10 MG/5ML PO SYRP
5.0000 mL | ORAL_SOLUTION | ORAL | Status: DC | PRN
  Administered 2022-11-25 – 2022-11-26 (×2): 5 mL via ORAL
  Filled 2022-11-25 (×2): qty 5

## 2022-11-25 MED ORDER — POTASSIUM CHLORIDE CRYS ER 20 MEQ PO TBCR
40.0000 meq | EXTENDED_RELEASE_TABLET | Freq: Once | ORAL | Status: AC
  Administered 2022-11-25: 40 meq via ORAL
  Filled 2022-11-25: qty 2

## 2022-11-25 MED ORDER — SODIUM CHLORIDE 0.9 % IV SOLN
1.0000 g | INTRAVENOUS | Status: DC
  Administered 2022-11-25 – 2022-11-26 (×2): 1 g via INTRAVENOUS
  Filled 2022-11-25 (×2): qty 10

## 2022-11-25 MED ORDER — SODIUM CHLORIDE 0.9 % IV SOLN
500.0000 mg | INTRAVENOUS | Status: DC
  Administered 2022-11-25 – 2022-11-26 (×2): 500 mg via INTRAVENOUS
  Filled 2022-11-25 (×2): qty 5

## 2022-11-25 NOTE — Progress Notes (Addendum)
PROGRESS NOTE    Albert Wagner  BSJ:[id number] DOB: 08-Aug-1996 DOA: 11/24/2022 PCP: Patient, No Pcp Per   Brief Narrative: Albert Wagner is a 27 y.o. male with medical history significant of childhood asthma and tobacco use who presents with increasing shortness of breath.    Patient recent diagnosed with Flu about a week ago and seen at [facility] ED. However he left after refusing blood work. He was not treated with Tamiflu. Symptoms continued to worsening with cough, shortness of breath and decrease appetite.    In ED,  he was tacycardic, tachypneic with use of accessory muscle and hypoxic to 84%. He was given Duonebs, steroids with minimal improvement in symptoms and was additionally given magnesium and given continuous nebs. Also received Terbutaline. He was then transfer to [facility] for further management.  Assessment & Plan:   Principal Problem:   Acute hypoxic respiratory failure (HCC) Active Problems:   Multifocal pneumonia   Transaminitis   Tobacco abuse    Acute hypoxic respiratory failure ([place]) -Secondary to flu vs superimposed bacterial pneumonia -requiring HFNC 12 lpm  now down to 6 L -continue IV Rocephin and Azithromycin in ED.  -Tamiflu.  -continue to wean O2 as tolerated with goal of > 92% -PRN albuterol nebulizer  Wbc trending up     Tobacco abuse Reports at least half pack daily. Last use a week ago before illness. Encourage cessation.   Transaminitis -mildly elevated. Likely reactive to infection. Follow with repeat CMP in the morning. Gb US  Hypokalemia replete Check mag     Estimated body mass index is 29.7 kg/m as calculated from the following:   Height as of this encounter: 5\' 10"  (1.778 m).   Weight as of this encounter: 93.9 kg.  DVT prophylaxis: lovenox Code Status: full Family Communication: mother at bed side Disposition Plan:  Status is: Inpatient Remains inpatient appropriate because: Acute hypoxic respiratory failure due to pneumonia and  influenza   Consultants:  None Procedures: None  Antimicrobials: Rocephin azithromycin  Subjective: Feels better than yesterday still requiring 6 L of oxygen  Objective: Vitals:   11/25/22 0900 11/25/22 0925 11/25/22 1000 11/25/22 1145  BP: 136/86  (!) 152/97   Pulse: 85 89 93   Resp: (!) 25 (!) 31 (!) 24   Temp:    98.4 F (36.9 C)  TempSrc:    Oral  SpO2: 98% 99% 97%   Weight:      Height:        Intake/Output Summary (Last 24 hours) at 11/25/2022 1203 Last data filed at 11/25/2022 1038 Gross per 24 hour  Intake 898.81 ml  Output --  Net 898.81 ml   Filed Weights   11/24/22 1158 11/24/22 1811  Weight: 99.8 kg 93.9 kg    Examination:  General exam: Appears calm and comfortable  Respiratory system: Crackles and wheezing to auscultation. Respiratory effort normal. Cardiovascular system: S1 & S2 heard, RRR. No JVD, murmurs, rubs, gallops or clicks. No pedal edema. Gastrointestinal system: Abdomen is nondistended, soft and nontender. No organomegaly or masses felt. Normal bowel sounds heard. Central nervous system: Alert and oriented. No focal neurological deficits. Extremities: Symmetric 5 x 5 power. Skin: No rashes, lesions or ulcers Psychiatry: Judgement and insight appear normal. Mood & affect appropriate.     Data Reviewed: I have personally reviewed following labs and imaging studies  CBC: Recent Labs  Lab 11/24/22 1218 11/25/22 0957  WBC 19.6* 24.9*  NEUTROABS 15.4*  --   HGB 16.4 14.1  HCT  47.5 41.5  MCV 91.2 94.5  PLT 371 353   Basic Metabolic Panel: Recent Labs  Lab 11/24/22 1218 11/25/22 0957  NA 135 140  K 3.5 3.4*  CL 98 104  CO2 23 26  GLUCOSE 141* 171*  BUN 8 11  CREATININE 1.17 0.77  CALCIUM 8.5* 8.3*   GFR: Estimated Creatinine Clearance: 161.1 mL/min (by C-G formula based on SCr of 0.77 mg/dL). Liver Function Tests: Recent Labs  Lab 11/24/22 1218 11/25/22 0957  AST 54* 140*  ALT 71* 136*  ALKPHOS 117 86  BILITOT 0.7  0.4  PROT 8.1 6.2*  ALBUMIN 2.8* 2.4*   No results for input(s): "LIPASE", "AMYLASE" in the last 168 hours. No results for input(s): "AMMONIA" in the last 168 hours. Coagulation Profile: No results for input(s): "INR", "PROTIME" in the last 168 hours. Cardiac Enzymes: No results for input(s): "CKTOTAL", "CKMB", "CKMBINDEX", "TROPONINI" in the last 168 hours. BNP (last 3 results) No results for input(s): "PROBNP" in the last 8760 hours. HbA1C: No results for input(s): "HGBA1C" in the last 72 hours. CBG: No results for input(s): "GLUCAP" in the last 168 hours. Lipid Profile: No results for input(s): "CHOL", "HDL", "LDLCALC", "TRIG", "CHOLHDL", "LDLDIRECT" in the last 72 hours. Thyroid Function Tests: No results for input(s): "TSH", "T4TOTAL", "FREET4", "T3FREE", "THYROIDAB" in the last 72 hours. Anemia Panel: No results for input(s): "VITAMINB12", "FOLATE", "FERRITIN", "TIBC", "IRON", "RETICCTPCT" in the last 72 hours. Sepsis Labs: Recent Labs  Lab 11/24/22 1409 11/24/22 2036  PROCALCITON  --  0.16  LATICACIDVEN 1.8  --     Recent Results (from the past 240 hour(s))  Resp panel by RT-PCR (RSV, Flu A&B, Covid) Anterior Nasal Swab     Status: Abnormal   Collection Time: 11/16/22 10:22 PM   Specimen: Anterior Nasal Swab  Result Value Ref Range Status   SARS Coronavirus 2 by RT PCR NEGATIVE NEGATIVE Final    Comment: (NOTE) SARS-CoV-2 target nucleic acids are NOT DETECTED.  The SARS-CoV-2 RNA is generally detectable in upper respiratory specimens during the acute phase of infection. The lowest concentration of SARS-CoV-2 viral copies this assay can detect is 138 copies/mL. A negative result does not preclude SARS-Cov-2 infection and should not be used as the sole basis for treatment or other patient management decisions. A negative result may occur with  improper specimen collection/handling, submission of specimen other than nasopharyngeal swab, presence of viral mutation(s)  within the areas targeted by this assay, and inadequate number of viral copies(<138 copies/mL). A negative result must be combined with clinical observations, patient history, and epidemiological information. The expected result is Negative.  Fact Sheet for Patients:  BloggerCourse.com  Fact Sheet for Healthcare Providers:  SeriousBroker.it  This test is no t yet approved or cleared by the [place] FDA and  has been authorized for detection and/or diagnosis of SARS-CoV-2 by FDA under an Emergency Use Authorization (EUA). This EUA will remain  in effect (meaning this test can be used) for the duration of the COVID-19 declaration under Section 564(b)(1) of the Act, 21 U.S.C.section 360bbb-3(b)(1), unless the authorization is terminated  or revoked sooner.       Influenza A by PCR POSITIVE (A) NEGATIVE Final   Influenza B by PCR NEGATIVE NEGATIVE Final    Comment: (NOTE) The Xpert Xpress SARS-CoV-2/FLU/RSV plus assay is intended as an aid in the diagnosis of influenza from Nasopharyngeal swab specimens and should not be used as a sole basis for treatment. Nasal washings and aspirates are unacceptable  for Xpert Xpress SARS-CoV-2/FLU/RSV testing.  Fact Sheet for Patients: BloggerCourse.com  Fact Sheet for Healthcare Providers: SeriousBroker.it  This test is not yet approved or cleared by the [place] FDA and has been authorized for detection and/or diagnosis of SARS-CoV-2 by FDA under an Emergency Use Authorization (EUA). This EUA will remain in effect (meaning this test can be used) for the duration of the COVID-19 declaration under Section 564(b)(1) of the Act, 21 U.S.C. section 360bbb-3(b)(1), unless the authorization is terminated or revoked.     Resp Syncytial Virus by PCR NEGATIVE NEGATIVE Final    Comment: (NOTE) Fact Sheet for  Patients: BloggerCourse.com  Fact Sheet for Healthcare Providers: SeriousBroker.it  This test is not yet approved or cleared by the [place] FDA and has been authorized for detection and/or diagnosis of SARS-CoV-2 by FDA under an Emergency Use Authorization (EUA). This EUA will remain in effect (meaning this test can be used) for the duration of the COVID-19 declaration under Section 564(b)(1) of the Act, 21 U.S.C. section 360bbb-3(b)(1), unless the authorization is terminated or revoked.  Performed at [facility], [address] [place], [place] [address]   Resp panel by RT-PCR (RSV, Flu A&B, Covid) Anterior Nasal Swab     Status: Abnormal   Collection Time: 11/24/22 12:18 PM   Specimen: Anterior Nasal Swab  Result Value Ref Range Status   SARS Coronavirus 2 by RT PCR NEGATIVE NEGATIVE Final    Comment: (NOTE) SARS-CoV-2 target nucleic acids are NOT DETECTED.  The SARS-CoV-2 RNA is generally detectable in upper respiratory specimens during the acute phase of infection. The lowest concentration of SARS-CoV-2 viral copies this assay can detect is 138 copies/mL. A negative result does not preclude SARS-Cov-2 infection and should not be used as the sole basis for treatment or other patient management decisions. A negative result may occur with  improper specimen collection/handling, submission of specimen other than nasopharyngeal swab, presence of viral mutation(s) within the areas targeted by this assay, and inadequate number of viral copies(<138 copies/mL). A negative result must be combined with clinical observations, patient history, and epidemiological information. The expected result is Negative.  Fact Sheet for Patients:  BloggerCourse.com  Fact Sheet for Healthcare Providers:  SeriousBroker.it  This test is no t yet approved or cleared by the [place] FDA and  has been authorized for detection and/or diagnosis of SARS-CoV-2 by FDA under an Emergency Use Authorization (EUA). This EUA will remain  in effect (meaning this test can be used) for the duration of the COVID-19 declaration under Section 564(b)(1) of the Act, 21 U.S.C.section 360bbb-3(b)(1), unless the authorization is terminated  or revoked sooner.       Influenza A by PCR POSITIVE (A) NEGATIVE Final   Influenza B by PCR NEGATIVE NEGATIVE Final    Comment: (NOTE) The Xpert Xpress SARS-CoV-2/FLU/RSV plus assay is intended as an aid in the diagnosis of influenza from Nasopharyngeal swab specimens and should not be used as a sole basis for treatment. Nasal washings and aspirates are unacceptable for Xpert Xpress SARS-CoV-2/FLU/RSV testing.  Fact Sheet for Patients: BloggerCourse.com  Fact Sheet for Healthcare Providers: SeriousBroker.it  This test is not yet approved or cleared by the [place] FDA and has been authorized for detection and/or diagnosis of SARS-CoV-2 by FDA under an Emergency Use Authorization (EUA). This EUA will remain in effect (meaning this test can be used) for the duration of the COVID-19 declaration under Section 564(b)(1) of the Act, 21 U.S.C. section 360bbb-3(b)(1), unless  the authorization is terminated or revoked.     Resp Syncytial Virus by PCR NEGATIVE NEGATIVE Final    Comment: (NOTE) Fact Sheet for Patients: EntrepreneurPulse.com.au  Fact Sheet for Healthcare Providers: IncredibleEmployment.be  This test is not yet approved or cleared by the [place] FDA and has been authorized for detection and/or diagnosis of SARS-CoV-2 by FDA under an Emergency Use Authorization (EUA). This EUA will remain in effect (meaning this test can be used) for the duration of the COVID-19 declaration under Section 564(b)(1) of the Act, 21 U.S.C. section  360bbb-3(b)(1), unless the authorization is terminated or revoked.  Performed at [facility], [address] [place], [place] [address]   Blood culture (routine x 2)     Status: None (Preliminary result)   Collection Time: 11/24/22  2:08 PM   Specimen: BLOOD LEFT HAND  Result Value Ref Range Status   Specimen Description   Final    BLOOD LEFT HAND BLOOD Performed at [facility], [address] [place], [place] [address]    Special Requests   Final    Blood Culture adequate volume BOTTLES DRAWN AEROBIC AND ANAEROBIC Performed at [facility], [address] [place], [place] [address]    Culture   Final    NO GROWTH < 12 HOURS Performed at [facility], [place] [address] [place], [place] [address]    Report Status PENDING  Incomplete  Blood culture (routine x 2)     Status: None (Preliminary result)   Collection Time: 11/24/22  2:10 PM   Specimen: BLOOD RIGHT HAND  Result Value Ref Range Status   Specimen Description   Final    BLOOD RIGHT HAND BLOOD Performed at [facility], [address] [place], [place] [address]    Special Requests   Final    Blood Culture adequate volume BOTTLES DRAWN AEROBIC ONLY Performed at [facility], [address] [place], [place] [address]    Culture   Final    NO GROWTH < 12 HOURS Performed at [facility], [place] [address], [place], [place] [address]    Report Status PENDING  Incomplete         Radiology Studies: DG Chest Portable 1 View  Result Date: 11/24/2022 CLINICAL DATA:  Shortness of breath. Patient diagnosed with flu last week. EXAM: PORTABLE CHEST 1 VIEW COMPARISON:  November 16, 2022 FINDINGS: The heart, hila, and mediastinum are normal. No pneumothorax. No nodules or masses. Bilateral pulmonary opacities, particularly in the bases, largely interstitial with a somewhat micro nodular appearance. No other abnormalities are identified. IMPRESSION: Bilateral  pulmonary opacities, particularly in the bases, with a somewhat micro nodular appearance. The findings are nonspecific but favored to represent atypical infection given history and a normal chest x-ray November 16, 2022. Recommend clinical correlation and short-term follow-up imaging to ensure resolution. Electronically Signed   By: Dorise Bullion III M.D.   On: 11/24/2022 12:20        Scheduled Meds:  Chlorhexidine Gluconate Cloth  6 each Topical Daily   enoxaparin (LOVENOX) injection  40 mg Subcutaneous Q24H   oseltamivir  75 mg Oral BID   Continuous Infusions:  sodium chloride Stopped (11/24/22 1419)   albuterol Stopped (11/24/22 1430)     LOS: 1 day    Time spent: 85 min  Georgette Shell, MD  11/25/2022, 12:03 PM

## 2022-11-25 NOTE — TOC Initial Note (Signed)
Transition of Care [facility]) - Initial/Assessment Note    Patient Details  Name: Albert Wagner MRN: [id number] Date of Birth: Jun 13, 1996  [facility] CM/SW Contact:    Dessa Phi, RN Phone Number: 11/25/2022, 12:15 PM  Clinical Narrative:From home on 02-monitor if needed at home.                   Expected Discharge Plan: Home/Self Care Barriers to Discharge: Continued Medical Work up   Patient Goals and CMS Choice            Expected Discharge Plan and Services                                              Prior Living Arrangements/Services                       Activities of Daily Living Home Assistive Devices/Equipment: None ADL Screening (condition at time of admission) Patient's cognitive ability adequate to safely complete daily activities?: Yes Is the patient deaf or have difficulty hearing?: No Does the patient have difficulty seeing, even when wearing glasses/contacts?: Yes Does the patient have difficulty concentrating, remembering, or making decisions?: No Patient able to express need for assistance with ADLs?: No Does the patient have difficulty dressing or bathing?: No Independently performs ADLs?: Yes (appropriate for developmental age) Does the patient have difficulty walking or climbing stairs?: No Weakness of Legs: None Weakness of Arms/Hands: None  Permission Sought/Granted                  Emotional Assessment              Admission diagnosis:  Influenza A [J10.1] Multifocal pneumonia [J18.9] Severe asthma with exacerbation, unspecified whether persistent [J45.901] Patient Active Problem List   Diagnosis Date Noted   Multifocal pneumonia 11/24/2022   Acute hypoxic respiratory failure ([place]) 11/24/2022   Transaminitis 11/24/2022   Tobacco abuse 11/24/2022   PCP:  Patient, No Pcp Per Pharmacy:  No Pharmacies Listed    Social Determinants of Health (SDOH) Social History: SDOH Screenings   Food  Insecurity: No Food Insecurity (11/24/2022)  Housing: Low Risk  (11/24/2022)  Transportation Needs: No Transportation Needs (11/24/2022)  Utilities: Not At Risk (11/24/2022)  Tobacco Use: Medium Risk (11/24/2022)   SDOH Interventions:     Readmission Risk Interventions     No data to display

## 2022-11-26 LAB — CBC
HCT: 42.7 % (ref 39.0–52.0)
Hemoglobin: 14.2 g/dL (ref 13.0–17.0)
MCH: 31.7 pg (ref 26.0–34.0)
MCHC: 33.3 g/dL (ref 30.0–36.0)
MCV: 95.3 fL (ref 80.0–100.0)
Platelets: 375 10*3/uL (ref 150–400)
RBC: 4.48 MIL/uL (ref 4.22–5.81)
RDW: 13.2 % (ref 11.5–15.5)
WBC: 19.6 10*3/uL — ABNORMAL HIGH (ref 4.0–10.5)
nRBC: 0 % (ref 0.0–0.2)

## 2022-11-26 LAB — HEPATITIS PANEL, ACUTE
HCV Ab: NONREACTIVE
Hep A IgM: NONREACTIVE
Hep B C IgM: NONREACTIVE
Hepatitis B Surface Ag: NONREACTIVE

## 2022-11-26 LAB — COMPREHENSIVE METABOLIC PANEL
ALT: 245 U/L — ABNORMAL HIGH (ref 0–44)
AST: 161 U/L — ABNORMAL HIGH (ref 15–41)
Albumin: 2.6 g/dL — ABNORMAL LOW (ref 3.5–5.0)
Alkaline Phosphatase: 88 U/L (ref 38–126)
Anion gap: 10 (ref 5–15)
BUN: 13 mg/dL (ref 6–20)
CO2: 27 mmol/L (ref 22–32)
Calcium: 8 mg/dL — ABNORMAL LOW (ref 8.9–10.3)
Chloride: 102 mmol/L (ref 98–111)
Creatinine, Ser: 0.72 mg/dL (ref 0.61–1.24)
GFR, Estimated: >60 mL/min (ref >60)
Glucose, Bld: 100 mg/dL — ABNORMAL HIGH (ref 70–99)
Potassium: 3.2 mmol/L — ABNORMAL LOW (ref 3.5–5.1)
Sodium: 139 mmol/L (ref 135–145)
Total Bilirubin: 0.4 mg/dL (ref 0.3–1.2)
Total Protein: 6.1 g/dL — ABNORMAL LOW (ref 6.5–8.1)

## 2022-11-26 MED ORDER — LIDOCAINE-PRILOCAINE 2.5-2.5 % EX CREA
TOPICAL_CREAM | Freq: Once | CUTANEOUS | Status: DC
  Filled 2022-11-26: qty 5

## 2022-11-26 MED ORDER — ALBUTEROL SULFATE HFA 108 (90 BASE) MCG/ACT IN AERS: 2.0000 | INHALATION_SPRAY | Freq: Four times a day (QID) | RESPIRATORY_TRACT | 2 refills | Status: AC | PRN

## 2022-11-26 MED ORDER — AMOXICILLIN-POT CLAVULANATE 500-125 MG PO TABS: 1.0000 | ORAL_TABLET | Freq: Three times a day (TID) | ORAL | 0 refills | Status: AC

## 2022-11-26 MED ORDER — OSELTAMIVIR PHOSPHATE 75 MG PO CAPS: 75.0000 mg | ORAL_CAPSULE | Freq: Two times a day (BID) | ORAL | 0 refills | Status: AC

## 2022-11-26 NOTE — Plan of Care (Signed)

## 2022-11-26 NOTE — Progress Notes (Signed)
   11/26/22 1250  Vitals  Patient Position (if appropriate) Standing  Oxygen Therapy  SpO2 92 %  O2 Device Room Air   Ambulated without Supplemental O2. Maintained sats at 92% without sx.

## 2022-11-26 NOTE — Discharge Summary (Signed)
Physician Discharge Summary  Albert Wagner UJW:[id number] DOB: 1996-05-12 DOA: 11/24/2022  PCP: Patient, No Pcp Per  Admit date: 11/24/2022 Discharge date: 11/26/2022  Admitted From: Home Disposition: Home   Recommendations for Outpatient Follow-up:  Follow up with PCP in 1-2 weeks Please obtain BMP/CBC in one week Follow-up with community health and wellness. Please follow-up CMP as you had low potassium and elevated LFTs during the hospital stay. Home Health:none Equipment/Devices:none  Discharge Condition:stable CODE STATUS:full Diet recommendation:cardiac Brief/Interim Summary:   Albert CrewsSukhvir Wagner is a 27 y.o. male with medical history significant of childhood asthma and tobacco use who presented with increasing shortness of breath.    Patient recent diagnosed with Flu about a week ago and seen [facility] ED. However he left after refusing blood work. He was not treated with Tamiflu. Symptoms continued to worsening with cough, shortness of breath and decrease appetite.    In ED,  he was tacycardic, tachypneic with use of accessory muscle and hypoxic to 84%. He was given Duonebs, steroids with minimal improvement in symptoms and was additionally given magnesium and given continuous nebs. Also received Terbutaline. He was then transfer to [facility] for [facility] management.  Discharge Diagnoses:  Principal Problem:   Acute hypoxic respiratory failure (HCC) Active Problems:   Multifocal pneumonia   Transaminitis   Tobacco abuse    Acute hypoxic respiratory failure (HCC) -Secondary to flu and superimposed bacterial pneumonia.  He required high flow nasal cannula 12 L/min on admission.  On the day of discharge he was able to maintain saturation above 92% on ambulation on room air.  Patient was treated with Tamiflu Rocephin and azithromycin during the hospital stay.  He was discharged on Tamiflu and Augmentin.      Tobacco abuse Reports at least half pack daily. Last use a week ago before illness.  Encourage cessation.   Transaminitis -mildly elevated. Likely reactive to infection.  Gallbladder ultrasound shows he may have chronic cholecystitis.  He did not have any symptoms.  Monitor this as an outpatient.  Hepatitis panel was negative.  HIV was negative.   Hypokalemia repleted   Estimated body mass index is 29.7 kg/m as calculated from the following:   Height as of this encounter: 5\' 10"  (1.778 m).   Weight as of this encounter: 93.9 kg.  Discharge Instructions  Discharge Instructions     Diet - low sodium heart healthy   Complete by: As directed    Increase activity slowly   Complete by: As directed       Allergies as of 11/26/2022   No Known Allergies      Medication List     TAKE these medications    acetaminophen 325 MG tablet Commonly known as: TYLENOL Take 650 mg by mouth every 6 (six) hours as needed for moderate pain.   albuterol 108 (90 Base) MCG/ACT inhaler Commonly known as: VENTOLIN HFA Inhale 2 puffs into the lungs every 6 (six) hours as needed for wheezing or shortness of breath.   amoxicillin-clavulanate 500-125 MG tablet Commonly known as: Augmentin Take 1 tablet by mouth 3 (three) times daily.   oseltamivir 75 MG capsule Commonly known as: TAMIFLU Take 1 capsule (75 mg total) by mouth 2 (two) times daily.        No Known Allergies  Consultations: None   Procedures/Studies: US Abdomen Limited RUQ (LIVER/GB)  Result Date: 11/25/2022 CLINICAL DATA:  Elevated liver function tests EXAM: ULTRASOUND ABDOMEN LIMITED RIGHT UPPER QUADRANT COMPARISON:  None Available. FINDINGS: Gallbladder: Gallbladder  is smaller than usual in size. Technologist did not observe any tenderness over the gallbladder. There is wall thickening in gallbladder measuring 3.6 mm. There is no fluid around the gallbladder. Common bile duct: Diameter: 5 mm Liver: No focal lesion identified. Within normal limits in parenchymal echogenicity. Portal vein is patent on color  Doppler imaging with normal direction of blood flow towards the liver. Other: None. IMPRESSION: Gallbladder is not distended. There is mild diffuse wall thickening in gallbladder which may be due to incomplete distention or suggest chronic cholecystitis. There are no imaging signs of acute cholecystitis. There is no dilation of bile ducts. Electronically Signed   By: Ernie AvenaPalani  Rathinasamy M.D.   On: 11/25/2022 20:02   DG Chest Portable 1 View  Result Date: 11/24/2022 CLINICAL DATA:  Shortness of breath. Patient diagnosed with flu last week. EXAM: PORTABLE CHEST 1 VIEW COMPARISON:  November 16, 2022 FINDINGS: The heart, hila, and mediastinum are normal. No pneumothorax. No nodules or masses. Bilateral pulmonary opacities, particularly in the bases, largely interstitial with a somewhat micro nodular appearance. No other abnormalities are identified. IMPRESSION: Bilateral pulmonary opacities, particularly in the bases, with a somewhat micro nodular appearance. The findings are nonspecific but favored to represent atypical infection given history and a normal chest x-ray November 16, 2022. Recommend clinical correlation and short-term follow-up imaging to ensure resolution. Electronically Signed   By: Gerome Samavid  Williams III M.D.   On: 11/24/2022 12:20   DG Chest 2 View  Result Date: 11/16/2022 CLINICAL DATA:  Cough. EXAM: CHEST - 2 VIEW COMPARISON:  None Available. FINDINGS: The heart size and mediastinal contours are within normal limits. Both lungs are clear. The visualized skeletal structures are unremarkable. IMPRESSION: No active cardiopulmonary disease. Electronically Signed   By: Elgie CollardArash  Radparvar M.D.   On: 11/16/2022 22:49   (Echo, Carotid, EGD, Colonoscopy, ERCP)    Subjective:  He is resting in bed anxious to go home has a good appetite no new complaints ambulated in the hallway able to maintain saturation. Discharge Exam: Vitals:   11/26/22 1247 11/26/22 1250  BP:    Pulse:    Resp:    Temp:     SpO2: 96% 92%   Vitals:   11/26/22 0957 11/26/22 1245 11/26/22 1247 11/26/22 1250  BP: (!) 141/98     Pulse: 73     Resp: 18     Temp: 98.2 F (36.8 C)     TempSrc: Oral     SpO2: 100% 98% 96% 92%  Weight:      Height:        General: Pt is alert, awake, not in acute distress Cardiovascular: RRR, S1/S2 +, no rubs, no gallops Respiratory : Few scattered wheezes bilaterally, no wheezing, no rhonchi Abdominal: Soft, NT, ND, bowel sounds + Extremities: no edema, no cyanosis    The results of significant diagnostics from this hospitalization (including imaging, microbiology, ancillary and laboratory) are listed below for reference.     Microbiology: Recent Results (from the past 240 hour(s))  Resp panel by RT-PCR (RSV, Flu A&B, Covid) Anterior Nasal Swab     Status: Abnormal   Collection Time: 11/16/22 10:22 PM   Specimen: Anterior Nasal Swab  Result Value Ref Range Status   SARS Coronavirus 2 by RT PCR NEGATIVE NEGATIVE Final    Comment: (NOTE) SARS-CoV-2 target nucleic acids are NOT DETECTED.  The SARS-CoV-2 RNA is generally detectable in upper respiratory specimens during the acute phase of infection. The lowest concentration of SARS-CoV-2 viral  copies this assay can detect is 138 copies/mL. A negative result does not preclude SARS-Cov-2 infection and should not be used as the sole basis for treatment or other patient management decisions. A negative result may occur with  improper specimen collection/handling, submission of specimen other than nasopharyngeal swab, presence of viral mutation(s) within the areas targeted by this assay, and inadequate number of viral copies(<138 copies/mL). A negative result must be combined with clinical observations, patient history, and epidemiological information. The expected result is Negative.  Fact Sheet for Patients:  EntrepreneurPulse.com.au  Fact Sheet for Healthcare Providers:   IncredibleEmployment.be  This test is no t yet approved or cleared by the [place] FDA and  has been authorized for detection and/or diagnosis of SARS-CoV-2 by FDA under an Emergency Use Authorization (EUA). This EUA will remain  in effect (meaning this test can be used) for the duration of the COVID-19 declaration under Section 564(b)(1) of the Act, 21 U.S.C.section 360bbb-3(b)(1), unless the authorization is terminated  or revoked sooner.       Influenza A by PCR POSITIVE (A) NEGATIVE Final   Influenza B by PCR NEGATIVE NEGATIVE Final    Comment: (NOTE) The Xpert Xpress SARS-CoV-2/FLU/RSV plus assay is intended as an aid in the diagnosis of influenza from Nasopharyngeal swab specimens and should not be used as a sole basis for treatment. Nasal washings and aspirates are unacceptable for Xpert Xpress SARS-CoV-2/FLU/RSV testing.  Fact Sheet for Patients: EntrepreneurPulse.com.au  Fact Sheet for Healthcare Providers: IncredibleEmployment.be  This test is not yet approved or cleared by the [place] FDA and has been authorized for detection and/or diagnosis of SARS-CoV-2 by FDA under an Emergency Use Authorization (EUA). This EUA will remain in effect (meaning this test can be used) for the duration of the COVID-19 declaration under Section 564(b)(1) of the Act, 21 U.S.C. section 360bbb-3(b)(1), unless the authorization is terminated or revoked.     Resp Syncytial Virus by PCR NEGATIVE NEGATIVE Final    Comment: (NOTE) Fact Sheet for Patients: EntrepreneurPulse.com.au  Fact Sheet for Healthcare Providers: IncredibleEmployment.be  This test is not yet approved or cleared by the [place] FDA and has been authorized for detection and/or diagnosis of SARS-CoV-2 by FDA under an Emergency Use Authorization (EUA). This EUA will remain in effect (meaning this test can be used)  for the duration of the COVID-19 declaration under Section 564(b)(1) of the Act, 21 U.S.C. section 360bbb-3(b)(1), unless the authorization is terminated or revoked.  Performed at [facility], [address] [place], [place] [address]   Resp panel by RT-PCR (RSV, Flu A&B, Covid) Anterior Nasal Swab     Status: Abnormal   Collection Time: 11/24/22 12:18 PM   Specimen: Anterior Nasal Swab  Result Value Ref Range Status   SARS Coronavirus 2 by RT PCR NEGATIVE NEGATIVE Final    Comment: (NOTE) SARS-CoV-2 target nucleic acids are NOT DETECTED.  The SARS-CoV-2 RNA is generally detectable in upper respiratory specimens during the acute phase of infection. The lowest concentration of SARS-CoV-2 viral copies this assay can detect is 138 copies/mL. A negative result does not preclude SARS-Cov-2 infection and should not be used as the sole basis for treatment or other patient management decisions. A negative result may occur with  improper specimen collection/handling, submission of specimen other than nasopharyngeal swab, presence of viral mutation(s) within the areas targeted by this assay, and inadequate number of viral copies(<138 copies/mL). A negative result must be combined with clinical observations, patient history, and epidemiological  information. The expected result is Negative.  Fact Sheet for Patients:  EntrepreneurPulse.com.au  Fact Sheet for Healthcare Providers:  IncredibleEmployment.be  This test is no t yet approved or cleared by the [place] FDA and  has been authorized for detection and/or diagnosis of SARS-CoV-2 by FDA under an Emergency Use Authorization (EUA). This EUA will remain  in effect (meaning this test can be used) for the duration of the COVID-19 declaration under Section 564(b)(1) of the Act, 21 U.S.C.section 360bbb-3(b)(1), unless the authorization is terminated  or revoked sooner.       Influenza  A by PCR POSITIVE (A) NEGATIVE Final   Influenza B by PCR NEGATIVE NEGATIVE Final    Comment: (NOTE) The Xpert Xpress SARS-CoV-2/FLU/RSV plus assay is intended as an aid in the diagnosis of influenza from Nasopharyngeal swab specimens and should not be used as a sole basis for treatment. Nasal washings and aspirates are unacceptable for Xpert Xpress SARS-CoV-2/FLU/RSV testing.  Fact Sheet for Patients: EntrepreneurPulse.com.au  Fact Sheet for Healthcare Providers: IncredibleEmployment.be  This test is not yet approved or cleared by the [place] FDA and has been authorized for detection and/or diagnosis of SARS-CoV-2 by FDA under an Emergency Use Authorization (EUA). This EUA will remain in effect (meaning this test can be used) for the duration of the COVID-19 declaration under Section 564(b)(1) of the Act, 21 U.S.C. section 360bbb-3(b)(1), unless the authorization is terminated or revoked.     Resp Syncytial Virus by PCR NEGATIVE NEGATIVE Final    Comment: (NOTE) Fact Sheet for Patients: EntrepreneurPulse.com.au  Fact Sheet for Healthcare Providers: IncredibleEmployment.be  This test is not yet approved or cleared by the [place] FDA and has been authorized for detection and/or diagnosis of SARS-CoV-2 by FDA under an Emergency Use Authorization (EUA). This EUA will remain in effect (meaning this test can be used) for the duration of the COVID-19 declaration under Section 564(b)(1) of the Act, 21 U.S.C. section 360bbb-3(b)(1), unless the authorization is terminated or revoked.  Performed at [facility], [address] [place], [place] [address]   Blood culture (routine x 2)     Status: None (Preliminary result)   Collection Time: 11/24/22  2:08 PM   Specimen: BLOOD LEFT HAND  Result Value Ref Range Status   Specimen Description   Final    BLOOD LEFT HAND BLOOD Performed at [facility], [address] [place], [place] [address]    Special Requests   Final    Blood Culture adequate volume BOTTLES DRAWN AEROBIC AND ANAEROBIC Performed at [facility], [address] [place], [place] [address]    Culture   Final    NO GROWTH 2 DAYS Performed at [facility], [place] [address] [place], [place] [address]    Report Status PENDING  Incomplete  Blood culture (routine x 2)     Status: None (Preliminary result)   Collection Time: 11/24/22  2:10 PM   Specimen: BLOOD RIGHT HAND  Result Value Ref Range Status   Specimen Description   Final    BLOOD RIGHT HAND BLOOD Performed at [facility], [address] [place], [place] [address]    Special Requests   Final    Blood Culture adequate volume BOTTLES DRAWN AEROBIC ONLY Performed at [facility], [address] [place], [place] [address]    Culture   Final    NO GROWTH 2 DAYS Performed  at [facility], [address], [place], [place] [address]    Report Status PENDING  Incomplete     Labs: BNP (last 3 results) No results for input(s): "BNP" in the last 8760 hours. Basic Metabolic Panel: Recent Labs  Lab 11/24/22 1218 11/25/22 0957 11/26/22 0711  NA 135 140 139  K 3.5 3.4* 3.2*  CL 98 104 102  CO2 23 26 27   GLUCOSE 141* 171* 100*  BUN 8 11 13   CREATININE 1.17 0.77 0.72  CALCIUM 8.5* 8.3* 8.0*  MG  --  2.5*  --    Liver Function Tests: Recent Labs  Lab 11/24/22 1218 11/25/22 0957 11/26/22 0711  AST 54* 140* 161*  ALT 71* 136* 245*  ALKPHOS 117 86 88  BILITOT 0.7 0.4 0.4  PROT 8.1 6.2* 6.1*  ALBUMIN 2.8* 2.4* 2.6*   No results for input(s): "LIPASE", "AMYLASE" in the last 168 hours. No results for input(s): "AMMONIA" in the last 168 hours. CBC: Recent Labs  Lab 11/24/22 1218 11/25/22 0957 11/26/22 0711  WBC 19.6* 24.9* 19.6*  NEUTROABS 15.4*  --   --   HGB 16.4 14.1 14.2  HCT 47.5 41.5 42.7  MCV 91.2 94.5 95.3  PLT 371 353  375   Cardiac Enzymes: No results for input(s): "CKTOTAL", "CKMB", "CKMBINDEX", "TROPONINI" in the last 168 hours. BNP: Invalid input(s): "POCBNP" CBG: No results for input(s): "GLUCAP" in the last 168 hours. D-Dimer No results for input(s): "DDIMER" in the last 72 hours. Hgb A1c No results for input(s): "HGBA1C" in the last 72 hours. Lipid Profile No results for input(s): "CHOL", "HDL", "LDLCALC", "TRIG", "CHOLHDL", "LDLDIRECT" in the last 72 hours. Thyroid function studies No results for input(s): "TSH", "T4TOTAL", "T3FREE", "THYROIDAB" in the last 72 hours.  Invalid input(s): "FREET3" Anemia work up No results for input(s): "VITAMINB12", "FOLATE", "FERRITIN", "TIBC", "IRON", "RETICCTPCT" in the last 72 hours. Urinalysis    Component Value Date/Time   COLORURINE Straw 09/22/2013 1655   APPEARANCEUR Clear 09/22/2013 1655   LABSPEC 1.011 09/22/2013 1655   PHURINE 6.0 09/22/2013 1655   GLUCOSEU Negative 09/22/2013 1655   HGBUR Negative 09/22/2013 1655   BILIRUBINUR Negative 09/22/2013 1655   KETONESUR Negative 09/22/2013 1655   PROTEINUR Negative 09/22/2013 1655   NITRITE Negative 09/22/2013 1655   LEUKOCYTESUR Trace 09/22/2013 1655   Sepsis Labs Recent Labs  Lab 11/24/22 1218 11/25/22 0957 11/26/22 0711  WBC 19.6* 24.9* 19.6*   Microbiology Recent Results (from the past 240 hour(s))  Resp panel by RT-PCR (RSV, Flu A&B, Covid) Anterior Nasal Swab     Status: Abnormal   Collection Time: 11/16/22 10:22 PM   Specimen: Anterior Nasal Swab  Result Value Ref Range Status   SARS Coronavirus 2 by RT PCR NEGATIVE NEGATIVE Final    Comment: (NOTE) SARS-CoV-2 target nucleic acids are NOT DETECTED.  The SARS-CoV-2 RNA is generally detectable in upper respiratory specimens during the acute phase of infection. The lowest concentration of SARS-CoV-2 viral copies this assay can detect is 138 copies/mL. A negative result does not preclude SARS-Cov-2 infection and should not be  used as the sole basis for treatment or other patient management decisions. A negative result may occur with  improper specimen collection/handling, submission of specimen other than nasopharyngeal swab, presence of viral mutation(s) within the areas targeted by this assay, and inadequate number of viral copies(<138 copies/mL). A negative result must be combined with clinical observations, patient history, and epidemiological information. The expected result is Negative.  Fact Sheet  for Patients:  BloggerCourse.com  Fact Sheet for Healthcare Providers:  SeriousBroker.it  This test is no t yet approved or cleared by the [place] FDA and  has been authorized for detection and/or diagnosis of SARS-CoV-2 by FDA under an Emergency Use Authorization (EUA). This EUA will remain  in effect (meaning this test can be used) for the duration of the COVID-19 declaration under Section 564(b)(1) of the Act, 21 U.S.C.section 360bbb-3(b)(1), unless the authorization is terminated  or revoked sooner.       Influenza A by PCR POSITIVE (A) NEGATIVE Final   Influenza B by PCR NEGATIVE NEGATIVE Final    Comment: (NOTE) The Xpert Xpress SARS-CoV-2/FLU/RSV plus assay is intended as an aid in the diagnosis of influenza from Nasopharyngeal swab specimens and should not be used as a sole basis for treatment. Nasal washings and aspirates are unacceptable for Xpert Xpress SARS-CoV-2/FLU/RSV testing.  Fact Sheet for Patients: BloggerCourse.com  Fact Sheet for Healthcare Providers: SeriousBroker.it  This test is not yet approved or cleared by the [place] FDA and has been authorized for detection and/or diagnosis of SARS-CoV-2 by FDA under an Emergency Use Authorization (EUA). This EUA will remain in effect (meaning this test can be used) for the duration of the COVID-19 declaration under Section  564(b)(1) of the Act, 21 U.S.C. section 360bbb-3(b)(1), unless the authorization is terminated or revoked.     Resp Syncytial Virus by PCR NEGATIVE NEGATIVE Final    Comment: (NOTE) Fact Sheet for Patients: BloggerCourse.com  Fact Sheet for Healthcare Providers: SeriousBroker.it  This test is not yet approved or cleared by the [place] FDA and has been authorized for detection and/or diagnosis of SARS-CoV-2 by FDA under an Emergency Use Authorization (EUA). This EUA will remain in effect (meaning this test can be used) for the duration of the COVID-19 declaration under Section 564(b)(1) of the Act, 21 U.S.C. section 360bbb-3(b)(1), unless the authorization is terminated or revoked.  Performed at [facility], [address] [place], [place] [address]   Resp panel by RT-PCR (RSV, Flu A&B, Covid) Anterior Nasal Swab     Status: Abnormal   Collection Time: 11/24/22 12:18 PM   Specimen: Anterior Nasal Swab  Result Value Ref Range Status   SARS Coronavirus 2 by RT PCR NEGATIVE NEGATIVE Final    Comment: (NOTE) SARS-CoV-2 target nucleic acids are NOT DETECTED.  The SARS-CoV-2 RNA is generally detectable in upper respiratory specimens during the acute phase of infection. The lowest concentration of SARS-CoV-2 viral copies this assay can detect is 138 copies/mL. A negative result does not preclude SARS-Cov-2 infection and should not be used as the sole basis for treatment or other patient management decisions. A negative result may occur with  improper specimen collection/handling, submission of specimen other than nasopharyngeal swab, presence of viral mutation(s) within the areas targeted by this assay, and inadequate number of viral copies(<138 copies/mL). A negative result must be combined with clinical observations, patient history, and epidemiological information. The expected result is Negative.  Fact Sheet  for Patients:  BloggerCourse.com  Fact Sheet for Healthcare Providers:  SeriousBroker.it  This test is no t yet approved or cleared by the [place] FDA and  has been authorized for detection and/or diagnosis of SARS-CoV-2 by FDA under an Emergency Use Authorization (EUA). This EUA will remain  in effect (meaning this test can be used) for the duration of the COVID-19 declaration under Section 564(b)(1) of the Act, 21 U.S.C.section 360bbb-3(b)(1), unless the authorization is terminated  or revoked sooner.       Influenza A by PCR POSITIVE (A) NEGATIVE Final   Influenza B by PCR NEGATIVE NEGATIVE Final    Comment: (NOTE) The Xpert Xpress SARS-CoV-2/FLU/RSV plus assay is intended as an aid in the diagnosis of influenza from Nasopharyngeal swab specimens and should not be used as a sole basis for treatment. Nasal washings and aspirates are unacceptable for Xpert Xpress SARS-CoV-2/FLU/RSV testing.  Fact Sheet for Patients: BloggerCourse.com  Fact Sheet for Healthcare Providers: SeriousBroker.it  This test is not yet approved or cleared by the [place] FDA and has been authorized for detection and/or diagnosis of SARS-CoV-2 by FDA under an Emergency Use Authorization (EUA). This EUA will remain in effect (meaning this test can be used) for the duration of the COVID-19 declaration under Section 564(b)(1) of the Act, 21 U.S.C. section 360bbb-3(b)(1), unless the authorization is terminated or revoked.     Resp Syncytial Virus by PCR NEGATIVE NEGATIVE Final    Comment: (NOTE) Fact Sheet for Patients: BloggerCourse.com  Fact Sheet for Healthcare Providers: SeriousBroker.it  This test is not yet approved or cleared by the [place] FDA and has been authorized for detection and/or diagnosis of SARS-CoV-2 by FDA under an  Emergency Use Authorization (EUA). This EUA will remain in effect (meaning this test can be used) for the duration of the COVID-19 declaration under Section 564(b)(1) of the Act, 21 U.S.C. section 360bbb-3(b)(1), unless the authorization is terminated or revoked.  Performed at [facility], [address] [place], [place] [address]   Blood culture (routine x 2)     Status: None (Preliminary result)   Collection Time: 11/24/22  2:08 PM   Specimen: BLOOD LEFT HAND  Result Value Ref Range Status   Specimen Description   Final    BLOOD LEFT HAND BLOOD Performed at [facility], [address] [place], [place] [address]    Special Requests   Final    Blood Culture adequate volume BOTTLES DRAWN AEROBIC AND ANAEROBIC Performed at [facility], [address] [place], [place] [address]    Culture   Final    NO GROWTH 2 DAYS Performed at [facility], [address] [place], [place] [address]    Report Status PENDING  Incomplete  Blood culture (routine x 2)     Status: None (Preliminary result)   Collection Time: 11/24/22  2:10 PM   Specimen: BLOOD RIGHT HAND  Result Value Ref Range Status   Specimen Description   Final    BLOOD RIGHT HAND BLOOD Performed at [facility], [address] [place], [place] [address]    Special Requests   Final    Blood Culture adequate volume BOTTLES DRAWN AEROBIC ONLY Performed at [facility], [address] [address] [place], [place] [address]    Culture   Final    NO GROWTH 2 DAYS Performed at [facility], [address], [place], [place] [address]    Report Status PENDING  Incomplete     Time coordinating discharge:  39 minutes  SIGNED:   Alwyn Ren, MD  [facility] 11/26/2022, 4:44 PM

## 2022-11-26 NOTE — Progress Notes (Signed)
  Transition of Care [facility]) Screening Note   Patient Details  Name: Albert Wagner Date of Birth: July 24, 1996   Transition of Care [facility]) CM/SW Contact:    Vassie Moselle, LCSW Phone Number: 11/26/2022, 1:58 PM    Transition of Care Department [facility]) has reviewed patient and no TOC needs have been identified at this time. We will continue to monitor patient advancement through interdisciplinary progression rounds. If new patient transition needs arise, please place a TOC consult.

## 2022-11-29 LAB — CULTURE, BLOOD (ROUTINE X 2)
Culture: NO GROWTH
Culture: NO GROWTH
Special Requests: ADEQUATE
Special Requests: ADEQUATE
